# Patient Record
Sex: Male | Born: 1990 | Race: Black or African American | Hispanic: No | Marital: Single | Smoking: Current every day smoker
Health system: Southern US, Community
[De-identification: ages and names within clinical notes are randomized; demographics above are authoritative.]

## PROBLEM LIST (undated history)

## (undated) DIAGNOSIS — J302 Other seasonal allergic rhinitis: Secondary | ICD-10-CM

## (undated) DIAGNOSIS — K219 Gastro-esophageal reflux disease without esophagitis: Secondary | ICD-10-CM

## (undated) HISTORY — PX: NO PAST SURGERIES: SHX2092

---

## 2012-02-17 ENCOUNTER — Other Ambulatory Visit: Payer: Self-pay | Admitting: Gastroenterology

## 2012-02-17 DIAGNOSIS — R1013 Epigastric pain: Secondary | ICD-10-CM

## 2012-02-26 ENCOUNTER — Encounter (HOSPITAL_COMMUNITY)
Admission: RE | Admit: 2012-02-26 | Discharge: 2012-02-26 | Disposition: A | Discharge status: Home / Self Care | Payer: BC Managed Care – PPO | Source: Ambulatory Visit | Attending: Gastroenterology | Admitting: Gastroenterology

## 2012-02-26 DIAGNOSIS — R1013 Epigastric pain: Secondary | ICD-10-CM | POA: Insufficient documentation

## 2012-02-26 MED ORDER — TECHNETIUM TC 99M SULFUR COLLOID
2.0000 | Freq: Once | INTRAVENOUS | Status: AC | PRN
  Administered 2012-02-26: 2 via INTRAVENOUS

## 2013-05-20 ENCOUNTER — Emergency Department (HOSPITAL_BASED_OUTPATIENT_CLINIC_OR_DEPARTMENT_OTHER)
Admission: EM | Admit: 2013-05-20 | Discharge: 2013-05-20 | Disposition: A | Discharge status: Home / Self Care | Payer: BC Managed Care – PPO | Attending: Emergency Medicine | Admitting: Emergency Medicine

## 2013-05-20 ENCOUNTER — Encounter (HOSPITAL_BASED_OUTPATIENT_CLINIC_OR_DEPARTMENT_OTHER): Payer: Self-pay | Admitting: *Deleted

## 2013-05-20 DIAGNOSIS — F17200 Nicotine dependence, unspecified, uncomplicated: Secondary | ICD-10-CM | POA: Insufficient documentation

## 2013-05-20 DIAGNOSIS — S61209A Unspecified open wound of unspecified finger without damage to nail, initial encounter: Secondary | ICD-10-CM | POA: Insufficient documentation

## 2013-05-20 DIAGNOSIS — Y92009 Unspecified place in unspecified non-institutional (private) residence as the place of occurrence of the external cause: Secondary | ICD-10-CM | POA: Insufficient documentation

## 2013-05-20 DIAGNOSIS — Y939 Activity, unspecified: Secondary | ICD-10-CM | POA: Insufficient documentation

## 2013-05-20 DIAGNOSIS — S61219A Laceration without foreign body of unspecified finger without damage to nail, initial encounter: Secondary | ICD-10-CM

## 2013-05-20 DIAGNOSIS — W260XXA Contact with knife, initial encounter: Secondary | ICD-10-CM | POA: Insufficient documentation

## 2013-05-20 NOTE — ED Provider Notes (Signed)
CSN: [id number]     Arrival date & time 05/20/13  0009 History   None    Chief Complaint  Patient presents with  . Laceration    HPI  Accidentally cut his right index finger with a kitchen knife at home several hours ago. Has a loose skin flap. Minimal bleeding. Good feeling and use History reviewed. No pertinent past medical history. History reviewed. No pertinent past surgical history. History reviewed. No pertinent family history. History  Substance Use Topics  . Smoking status: Current Every Day Smoker -- 0.50 packs/day    Types: Cigarettes  . Smokeless tobacco: Not on file  . Alcohol Use: No    Review of Systems Number bleeding normal use no numbness single digit injury Allergies  Review of patient's allergies indicates no known allergies.  Home Medications  No current outpatient prescriptions on file. BP 154/59  Pulse 92  Resp 16  Ht 5\' 7"  (1.702 m)  Wt 155 lb (70.308 kg)  BMI 24.27 kg/m2  SpO2 100% Physical Exam  Musculoskeletal:  2 cm curvilinear laceration to the P3 radial lateral of the right index finger. Extends to but not into the nail bed. Normal sensation distally. Immediate Refill    ED Course  LACERATION REPAIR Date/Time: 05/20/2013 12:49 AM Performed by: Claudean Kinds Authorized by: Claudean Kinds Consent: Verbal consent obtained. Risks and benefits: risks, benefits and alternatives were discussed Consent given by: patient Laceration length: 2 cm Tendon involvement: none Nerve involvement: none Vascular damage: no Anesthesia: local infiltration Local anesthetic: lidocaine 2% without epinephrine Anesthetic total: 4 ml Irrigation solution: saline Irrigation method: syringe Amount of cleaning: standard Debridement: none Degree of undermining: none Skin closure: 4-0 nylon Number of sutures: 5 Technique: simple Approximation: close Approximation difficulty: simple Dressing: gauze roll Patient tolerance: Patient tolerated the  procedure well with no immediate complications.   (including critical care time) Labs Review Labs Reviewed - No data to display Imaging Review No results found.  MDM   1. Finger laceration, initial encounter    Wound distress patient is discharged good bleeding control after repair    Claudean Kinds, MD 05/20/13 [phone]

## 2013-05-20 NOTE — ED Notes (Signed)
MD at bedside. 
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
## 2013-05-24 ENCOUNTER — Emergency Department (HOSPITAL_BASED_OUTPATIENT_CLINIC_OR_DEPARTMENT_OTHER): Payer: BC Managed Care – PPO

## 2013-05-24 ENCOUNTER — Encounter (HOSPITAL_BASED_OUTPATIENT_CLINIC_OR_DEPARTMENT_OTHER): Payer: Self-pay

## 2013-05-24 ENCOUNTER — Emergency Department (HOSPITAL_BASED_OUTPATIENT_CLINIC_OR_DEPARTMENT_OTHER)
Admission: EM | Admit: 2013-05-24 | Discharge: 2013-05-24 | Disposition: A | Discharge status: Home / Self Care | Payer: BC Managed Care – PPO | Attending: Emergency Medicine | Admitting: Emergency Medicine

## 2013-05-24 DIAGNOSIS — F17200 Nicotine dependence, unspecified, uncomplicated: Secondary | ICD-10-CM | POA: Insufficient documentation

## 2013-05-24 DIAGNOSIS — K2970 Gastritis, unspecified, without bleeding: Secondary | ICD-10-CM | POA: Insufficient documentation

## 2013-05-24 LAB — COMPREHENSIVE METABOLIC PANEL
ALT: 17 U/L (ref 0–53)
Albumin: 4.4 g/dL (ref 3.5–5.2)
Calcium: 10.2 mg/dL (ref 8.4–10.5)
GFR calc Af Amer: 81 mL/min — ABNORMAL LOW (ref >90)
Glucose, Bld: 98 mg/dL (ref 70–99)
Sodium: 138 mEq/L (ref 135–145)
Total Protein: 7.5 g/dL (ref 6.0–8.3)

## 2013-05-24 LAB — CBC WITH DIFFERENTIAL/PLATELET
Basophils Relative: 0 % (ref 0–1)
Eosinophils Absolute: 0.1 10*3/uL (ref 0.0–0.7)
Eosinophils Relative: 1 % (ref 0–5)
Lymphs Abs: 1.7 10*3/uL (ref 0.7–4.0)
MCH: 29.4 pg (ref 26.0–34.0)
MCHC: 33.1 g/dL (ref 30.0–36.0)
MCV: 88.9 fL (ref 78.0–100.0)
Neutrophils Relative %: 58 % (ref 43–77)
Platelets: 249 10*3/uL (ref 150–400)
RBC: 4.76 MIL/uL (ref 4.22–5.81)
RDW: 12.3 % (ref 11.5–15.5)

## 2013-05-24 LAB — LIPASE, BLOOD: Lipase: 20 U/L (ref 11–59)

## 2013-05-24 LAB — URINALYSIS, ROUTINE W REFLEX MICROSCOPIC
Hgb urine dipstick: NEGATIVE
Nitrite: NEGATIVE
Specific Gravity, Urine: 1.014 (ref 1.005–1.030)
Urobilinogen, UA: 0.2 mg/dL (ref 0.0–1.0)
pH: 7 (ref 5.0–8.0)

## 2013-05-24 MED ORDER — LANSOPRAZOLE 30 MG PO CPDR: 30.0000 mg | DELAYED_RELEASE_CAPSULE | Freq: Every day | ORAL | Status: DC

## 2013-05-24 NOTE — ED Notes (Signed)
MD at bedside. 

## 2013-05-24 NOTE — ED Notes (Signed)
Epigastric pain x 1-2 weeks-pain is worse in the am and radiates to back with SOB

## 2013-05-24 NOTE — ED Provider Notes (Signed)
CSN: [id number]     Arrival date & time 05/24/13  1316 History   First MD Initiated Contact with Patient 05/24/13 1328     Chief Complaint  Patient presents with  . Abdominal Pain   (Consider location/radiation/quality/duration/timing/severity/associated sxs/prior Treatment) Patient is a 22 y.o. male presenting with abdominal pain.  Abdominal Pain  Pt reports about 2 weeks of moderate aching epigastric pain radiating into his mid back, worse first thing in the AM and occasionally exacerbated with eating. Denies any vomiting, has had some loose stools. No fever. No bloody or melanic stools. Does not use excessive NSAIDs or EtOH.   History reviewed. No pertinent past medical history. History reviewed. No pertinent past surgical history. No family history on file. History  Substance Use Topics  . Smoking status: Current Every Day Smoker -- 0.50 packs/day    Types: Cigarettes  . Smokeless tobacco: Not on file  . Alcohol Use: Yes    Review of Systems  Gastrointestinal: Positive for abdominal pain.   All other systems reviewed and are negative except as noted in HPI.   Allergies  Review of patient's allergies indicates no known allergies.  Home Medications  No current outpatient prescriptions on file. BP 126/77  Pulse 63  Temp(Src) 98.5 F (36.9 C) (Oral)  Resp 16  Ht 5\' 7"  (1.702 m)  Wt 155 lb (70.308 kg)  BMI 24.27 kg/m2  SpO2 100% Physical Exam  Nursing note and vitals reviewed. Constitutional: He is oriented to person, place, and time. He appears well-developed and well-nourished.  HENT:  Head: Normocephalic and atraumatic.  Eyes: EOM are normal. Pupils are equal, round, and reactive to light.  Neck: Normal range of motion. Neck supple.  Cardiovascular: Normal rate, normal heart sounds and intact distal pulses.   Pulmonary/Chest: Effort normal and breath sounds normal. He has no wheezes. He has no rales.  Abdominal: Bowel sounds are normal. He exhibits no distension.  There is tenderness (mild epigastric, no RUQ tenderness, neg Murphy's). There is no rebound and no guarding.  Musculoskeletal: Normal range of motion. He exhibits no edema and no tenderness.  Neurological: He is alert and oriented to person, place, and time. He has normal strength. No cranial nerve deficit or sensory deficit.  Skin: Skin is warm and dry. No rash noted.  Psychiatric: He has a normal mood and affect.    ED Course  Procedures (including critical care time) Labs Review Labs Reviewed  COMPREHENSIVE METABOLIC PANEL - Abnormal; Notable for the following:    Creatinine, Ser 1.40 (*)    GFR calc non Af Amer 70 (*)    GFR calc Af Amer 81 (*)    All other components within normal limits  CBC WITH DIFFERENTIAL  LIPASE, BLOOD  URINALYSIS, ROUTINE W REFLEX MICROSCOPIC   Imaging Review US Abdomen Complete  05/24/2013   *RADIOLOGY REPORT*  Clinical Data:  Epigastric pain.  Question gallstones.  COMPLETE ABDOMINAL ULTRASOUND  Comparison:  None.  Findings:  Gallbladder:  No gallstones, gallbladder wall thickening, or pericholecystic fluid.  Common bile duct:  Measures up to 2 mm, which is normal.  Liver:  No focal lesion identified.  Within normal limits in parenchymal echogenicity.  IVC:  Limited due to overlying bowel gas.  Pancreas:  Not well visualized due to overlying bowel gas.  Spleen:  Measures 7 cm.  Normal in appearance.  Right Kidney:  No renal calculi or hydronephrosis. Maximal dimension measures up to 10.1 cm.  Left Kidney:  No renal calculi or  hydronephrosis. Maximal dimension measures up to 10.2 cm.  Abdominal aorta:  Limited evaluation due to overlying bowel gas. The maximal diameter visualized is 1.9 cm, which is normal.  IMPRESSION: No cholelithiasis or evidence of acute cholecystitis.  Limited evaluation of the pancreas, IVC, and abdominal aorta.   Original Report Authenticated By: Jerene Dilling, M.D.    MDM   1. Gastritis     Labs and imaging reviewed and  unremarkable aside from mild renal insufficiency. Advised to drink plenty of fluids, PPI for possible gastritis and PCP/GI followup.     Malaak Stach B. Bernette Mayers, MD 05/24/13 1444

## 2013-05-31 ENCOUNTER — Emergency Department (HOSPITAL_BASED_OUTPATIENT_CLINIC_OR_DEPARTMENT_OTHER)
Admission: EM | Admit: 2013-05-31 | Discharge: 2013-05-31 | Disposition: A | Discharge status: Home / Self Care | Payer: PRIVATE HEALTH INSURANCE | Attending: Emergency Medicine | Admitting: Emergency Medicine

## 2013-05-31 ENCOUNTER — Encounter (HOSPITAL_BASED_OUTPATIENT_CLINIC_OR_DEPARTMENT_OTHER): Payer: Self-pay | Admitting: Emergency Medicine

## 2013-05-31 DIAGNOSIS — F17200 Nicotine dependence, unspecified, uncomplicated: Secondary | ICD-10-CM | POA: Insufficient documentation

## 2013-05-31 DIAGNOSIS — Z79899 Other long term (current) drug therapy: Secondary | ICD-10-CM | POA: Insufficient documentation

## 2013-05-31 DIAGNOSIS — Z4802 Encounter for removal of sutures: Secondary | ICD-10-CM

## 2013-05-31 NOTE — ED Notes (Signed)
Suture removal

## 2013-05-31 NOTE — ED Provider Notes (Signed)
Medical screening examination/treatment/procedure(s) were performed by non-physician practitioner and as supervising physician I was immediately available for consultation/collaboration.  Doug Sou, MD 05/31/13 1324

## 2013-05-31 NOTE — ED Provider Notes (Signed)
CSN: [id number]     Arrival date & time 05/31/13  1478 History   First MD Initiated Contact with Patient 05/31/13 [phone]     Chief Complaint  Patient presents with  . Suture / Staple Removal   (Consider location/radiation/quality/duration/timing/severity/associated sxs/prior Treatment) HPI Comments: Pt had suture placed in finger and pt is here for removal:denies any problems  Patient is a 22 y.o. male presenting with suture removal. The history is provided by the patient. No language interpreter was used.  Suture / Staple Removal This is a new problem. The current episode started 1 to 4 weeks ago. The problem occurs constantly. The problem has been unchanged.    History reviewed. No pertinent past medical history. History reviewed. No pertinent past surgical history. History reviewed. No pertinent family history. History  Substance Use Topics  . Smoking status: Current Every Day Smoker -- 0.50 packs/day    Types: Cigarettes  . Smokeless tobacco: Not on file  . Alcohol Use: Yes    Review of Systems  Constitutional: Negative.   Respiratory: Negative.   Cardiovascular: Negative.     Allergies  Review of patient's allergies indicates no known allergies.  Home Medications   Current Outpatient Rx  Name  Route  Sig  Dispense  Refill  . lansoprazole (PREVACID) 30 MG capsule   Oral   Take 1 capsule (30 mg total) by mouth daily.   30 capsule   0    BP 118/57  Pulse 69  Temp(Src) 98.4 F (36.9 C) (Oral)  SpO2 100% Physical Exam  Nursing note and vitals reviewed. Constitutional: He appears well-developed and well-nourished.  Cardiovascular: Normal rate and regular rhythm.   Pulmonary/Chest: Effort normal and breath sounds normal.  Skin:  Pt has well healed wound to the left index finger:pt has full rom:do redness or drainage noted to the area    ED Course  SUTURE REMOVAL Date/Time: 05/31/2013 10:02 AM Performed by: Teressa Lower Authorized by: Teressa Lower Consent: Verbal consent obtained. Risks and benefits: risks, benefits and alternatives were discussed Consent given by: patient Wound Appearance: clean Sutures Removed: 6 Facility: sutures placed in this facility Patient tolerance: Patient tolerated the procedure well with no immediate complications.   (including critical care time) Labs Review Labs Reviewed - No data to display Imaging Review No results found.  MDM   1. Visit for suture removal    Sutures removed without any problem:no sign of infection    Teressa Lower, NP 05/31/13 1003

## 2013-09-29 ENCOUNTER — Emergency Department (HOSPITAL_COMMUNITY)
Admission: EM | Admit: 2013-09-29 | Discharge: 2013-09-29 | Disposition: A | Discharge status: Home / Self Care | Payer: No Typology Code available for payment source | Attending: Emergency Medicine | Admitting: Emergency Medicine

## 2013-09-29 ENCOUNTER — Emergency Department (HOSPITAL_COMMUNITY): Payer: No Typology Code available for payment source

## 2013-09-29 ENCOUNTER — Encounter (HOSPITAL_COMMUNITY): Payer: Self-pay | Admitting: Emergency Medicine

## 2013-09-29 DIAGNOSIS — M542 Cervicalgia: Secondary | ICD-10-CM

## 2013-09-29 DIAGNOSIS — IMO0002 Reserved for concepts with insufficient information to code with codable children: Secondary | ICD-10-CM | POA: Insufficient documentation

## 2013-09-29 DIAGNOSIS — S199XXA Unspecified injury of neck, initial encounter: Principal | ICD-10-CM

## 2013-09-29 DIAGNOSIS — Y92410 Unspecified street and highway as the place of occurrence of the external cause: Secondary | ICD-10-CM | POA: Insufficient documentation

## 2013-09-29 DIAGNOSIS — F17200 Nicotine dependence, unspecified, uncomplicated: Secondary | ICD-10-CM | POA: Insufficient documentation

## 2013-09-29 DIAGNOSIS — S0993XA Unspecified injury of face, initial encounter: Secondary | ICD-10-CM | POA: Insufficient documentation

## 2013-09-29 DIAGNOSIS — Y9389 Activity, other specified: Secondary | ICD-10-CM | POA: Insufficient documentation

## 2013-09-29 HISTORY — DX: Other seasonal allergic rhinitis: J30.2

## 2013-09-29 HISTORY — DX: Gastro-esophageal reflux disease without esophagitis: K21.9

## 2013-09-29 MED ORDER — INFLUENZA VAC SPLIT QUAD 0.5 ML IM SUSP: 0.5000 mL | INTRAMUSCULAR | Status: DC

## 2013-09-29 MED ORDER — PNEUMOCOCCAL VAC POLYVALENT 25 MCG/0.5ML IJ INJ: 0.5000 mL | INJECTION | INTRAMUSCULAR | Status: DC

## 2013-09-29 MED ORDER — NAPROXEN 500 MG PO TABS: 500.0000 mg | ORAL_TABLET | Freq: Two times a day (BID) | ORAL | Status: DC
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
## 2013-09-29 NOTE — ED Notes (Signed)
MD at bedside. 

## 2013-10-02 NOTE — ED Provider Notes (Signed)
Medical screening examination/treatment/procedure(s) were performed by non-physician practitioner and as supervising physician I was immediately available for consultation/collaboration.  EKG Interpretation   None         Kameria Canizares, MD 10/02/13 0715 

## 2013-11-13 ENCOUNTER — Emergency Department (HOSPITAL_COMMUNITY): Payer: No Typology Code available for payment source

## 2013-11-13 ENCOUNTER — Encounter (HOSPITAL_COMMUNITY): Payer: Self-pay | Admitting: Emergency Medicine

## 2013-11-13 ENCOUNTER — Emergency Department (HOSPITAL_COMMUNITY)
Admission: EM | Admit: 2013-11-13 | Discharge: 2013-11-13 | Disposition: A | Discharge status: Home / Self Care | Payer: No Typology Code available for payment source | Attending: Emergency Medicine | Admitting: Emergency Medicine

## 2013-11-13 DIAGNOSIS — Z8719 Personal history of other diseases of the digestive system: Secondary | ICD-10-CM | POA: Insufficient documentation

## 2013-11-13 DIAGNOSIS — R079 Chest pain, unspecified: Secondary | ICD-10-CM | POA: Insufficient documentation

## 2013-11-13 DIAGNOSIS — F17200 Nicotine dependence, unspecified, uncomplicated: Secondary | ICD-10-CM | POA: Insufficient documentation

## 2013-11-13 LAB — BASIC METABOLIC PANEL
BUN: 15 mg/dL (ref 6–23)
CALCIUM: 9.5 mg/dL (ref 8.4–10.5)
CO2: 27 mEq/L (ref 19–32)
CREATININE: 1.02 mg/dL (ref 0.50–1.35)
Chloride: 99 mEq/L (ref 96–112)
GLUCOSE: 104 mg/dL — AB (ref 70–99)
POTASSIUM: 4.5 meq/L (ref 3.7–5.3)
Sodium: 137 mEq/L (ref 137–147)

## 2013-11-13 LAB — CBC
HEMATOCRIT: 41.7 % (ref 39.0–52.0)
HEMOGLOBIN: 13.7 g/dL (ref 13.0–17.0)
MCH: 29.3 pg (ref 26.0–34.0)
MCHC: 32.9 g/dL (ref 30.0–36.0)
MCV: 89.3 fL (ref 78.0–100.0)
Platelets: 238 10*3/uL (ref 150–400)
RBC: 4.67 MIL/uL (ref 4.22–5.81)
RDW: 12.7 % (ref 11.5–15.5)
WBC: 6.6 10*3/uL (ref 4.0–10.5)

## 2013-11-13 LAB — I-STAT TROPONIN, ED: Troponin i, poc: 0 ng/mL (ref 0.00–0.08)

## 2013-11-13 MED ORDER — HYDROCODONE-ACETAMINOPHEN 5-325 MG PO TABS
1.0000 | ORAL_TABLET | Freq: Once | ORAL | Status: AC
  Administered 2013-11-13: 1 via ORAL
  Filled 2013-11-13: qty 1

## 2013-11-13 MED ORDER — HYDROCODONE-ACETAMINOPHEN 5-325 MG PO TABS
1.0000 | ORAL_TABLET | ORAL | Status: AC | PRN
Start: 2013-11-13 — End: ?

## 2013-11-13 NOTE — Discharge Instructions (Signed)
Chest Pain (Nonspecific) °It is often hard to give a specific diagnosis for the cause of chest pain. There is always a chance that your pain could be related to something serious, such as a heart attack or a blood clot in the lungs. You need to follow up with your caregiver for further evaluation. °CAUSES  °· Heartburn. °· Pneumonia or bronchitis. °· Anxiety or stress. °· Inflammation around your heart (pericarditis) or lung (pleuritis or pleurisy). °· A blood clot in the lung. °· A collapsed lung (pneumothorax). It can develop suddenly on its own (spontaneous pneumothorax) or from injury (trauma) to the chest. °· Shingles infection (herpes zoster virus). °The chest wall is composed of bones, muscles, and cartilage. Any of these can be the source of the pain. °· The bones can be bruised by injury. °· The muscles or cartilage can be strained by coughing or overwork. °· The cartilage can be affected by inflammation and become sore (costochondritis). °DIAGNOSIS  °Lab tests or other studies, such as X-rays, electrocardiography, stress testing, or cardiac imaging, may be needed to find the cause of your pain.  °TREATMENT  °· Treatment depends on what may be causing your chest pain. Treatment may include: °· Acid blockers for heartburn. °· Anti-inflammatory medicine. °· Pain medicine for inflammatory conditions. °· Antibiotics if an infection is present. °· You may be advised to change lifestyle habits. This includes stopping smoking and avoiding alcohol, caffeine, and chocolate. °· You may be advised to keep your head raised (elevated) when sleeping. This reduces the chance of acid going backward from your stomach into your esophagus. °· Most of the time, nonspecific chest pain will improve within 2 to 3 days with rest and mild pain medicine. °HOME CARE INSTRUCTIONS  °· If antibiotics were prescribed, take your antibiotics as directed. Finish them even if you start to feel better. °· For the next few days, avoid physical  activities that bring on chest pain. Continue physical activities as directed. °· Do not smoke. °· Avoid drinking alcohol. °· Only take over-the-counter or prescription medicine for pain, discomfort, or fever as directed by your caregiver. °· Follow your caregiver's suggestions for further testing if your chest pain does not go away. °· Keep any follow-up appointments you made. If you do not go to an appointment, you could develop lasting (chronic) problems with pain. If there is any problem keeping an appointment, you must call to reschedule. °SEEK MEDICAL CARE IF:  °· You think you are having problems from the medicine you are taking. Read your medicine instructions carefully. °· Your chest pain does not go away, even after treatment. °· You develop a rash with blisters on your chest. °SEEK IMMEDIATE MEDICAL CARE IF:  °· You have increased chest pain or pain that spreads to your arm, neck, jaw, back, or abdomen. °· You develop shortness of breath, an increasing cough, or you are coughing up blood. °· You have severe back or abdominal pain, feel nauseous, or vomit. °· You develop severe weakness, fainting, or chills. °· You have a fever. °THIS IS AN EMERGENCY. Do not wait to see if the pain will go away. Get medical help at once. Call your local emergency services (911 in [place] Do not drive yourself to the hospital. °MAKE SURE YOU:  °· Understand these instructions. °· Will watch your condition. °· Will get help right away if you are not doing well or get worse. °Document Released: 06/12/2005 Document Revised: 11/25/2011 Document Reviewed: 04/07/2008 °[employer]® Patient Information ©2014 [employer]   [employer]. ° °

## 2013-11-13 NOTE — ED Notes (Signed)
Pt from home c/o chest pain x1 week. Pt states that the pain started in his back and now in chest. Pt denies cough, N/V, SOB or diaphoresis. Pt is A&O and in NAD

## 2013-11-13 NOTE — ED Provider Notes (Addendum)
CSN: [id number]     Arrival date & time 11/13/13  1148 History   First MD Initiated Contact with Patient 11/13/13 1159     Chief Complaint  Patient presents with  . Chest Pain     (Consider location/radiation/quality/duration/timing/severity/associated sxs/prior Treatment) HPI Comments: Feels like he has palpitations. Pt state that it often happens at night. Denies drug use, cough, fever, abdominal pain  Patient is a 23 y.o. male presenting with chest pain. The history is provided by the patient. No language interpreter was used.  Chest Pain Pain location:  L chest Pain quality: aching   Pain radiates to:  Does not radiate Pain radiates to the back: no   Pain severity:  Mild Onset quality:  Sudden Timing:  Intermittent Progression:  Unchanged Context: breathing   Context: no drug use, no movement and no stress   Relieved by:  Nothing Ineffective treatments: nsaids. Associated symptoms: no fever, no nausea, no shortness of breath and not vomiting     Past Medical History  Diagnosis Date  . Seasonal allergies   . GERD (gastroesophageal reflux disease)    Past Surgical History  Procedure Laterality Date  . No past surgeries     No family history on file. History  Substance Use Topics  . Smoking status: Current Every Day Smoker -- 0.50 packs/day for 6 years    Types: Cigarettes  . Smokeless tobacco: Never Used  . Alcohol Use: Yes     Comment: "time to time"    Review of Systems  Constitutional: Negative for fever.  Respiratory: Negative for shortness of breath.   Cardiovascular: Positive for chest pain.  Gastrointestinal: Negative for nausea and vomiting.      Allergies  Review of patient's allergies indicates no known allergies.  Home Medications   Current Outpatient Rx  Name  Route  Sig  Dispense  Refill  . ibuprofen (ADVIL,MOTRIN) 200 MG tablet   Oral   Take 400 mg by mouth every 6 (six) hours as needed for headache or moderate pain.           BP  140/59  Pulse 83  Temp(Src) 97.7 F (36.5 C)  Resp 20  SpO2 100% Physical Exam  Nursing note and vitals reviewed. Constitutional: He is oriented to person, place, and time. He appears well-developed and well-nourished.  HENT:  Head: Normocephalic and atraumatic.  Cardiovascular: Normal rate and regular rhythm.   Pulmonary/Chest: Effort normal and breath sounds normal.  Musculoskeletal: Normal range of motion.  Neurological: He is alert and oriented to person, place, and time.  Skin: Skin is warm and dry.    ED Course  Procedures (including critical care time) Labs Review Labs Reviewed  BASIC METABOLIC PANEL - Abnormal; Notable for the following:    Glucose, Bld 104 (*)    All other components within normal limits  CBC  I-STAT TROPOININ, ED   Imaging Review Dg Chest 2 View  11/13/2013   CLINICAL DATA:  Left side chest pain  EXAM: CHEST  2 VIEW  COMPARISON:  None.  FINDINGS: Cardiomediastinal silhouette is stable. No acute infiltrate or pleural effusion. No pulmonary edema. Bony thorax is unremarkable.  IMPRESSION: No active cardiopulmonary disease.   Electronically Signed   By: Natasha MeadLiviu  Pop M.D.   On: 11/13/2013 13:10     EKG Interpretation   Date/Time:  Saturday November 13 2013 12:01:24 EST Ventricular Rate:  56 PR Interval:  173 QRS Duration: 86 QT Interval:  385 QTC Calculation: 371 R Axis:  51 Text Interpretation:  Sinus rhythm Ventricular premature complex  Borderline T wave abnormalities No old tracing to compare Confirmed by  [facility]  MD, Leonette Most [id number]) on 11/13/2013 12:29:18 PM      MDM   Final diagnoses:  Chest pain    Pt is pain free at this time:pt is okay to follow up as needed:will discharge with something for pain. Doubt acs or pe.     Teressa Lower, NP 11/13/13 1439  Teressa Lower, NP 11/24/13 1215

## 2013-11-13 NOTE — ED Provider Notes (Signed)
Medical screening examination/treatment/procedure(s) were performed by non-physician practitioner and as supervising physician I was immediately available for consultation/collaboration.   EKG Interpretation   Date/Time:  Saturday November 13 2013 12:01:24 EST Ventricular Rate:  56 PR Interval:  173 QRS Duration: 86 QT Interval:  385 QTC Calculation: 371 R Axis:   51 Text Interpretation:  Sinus rhythm Ventricular premature complex  Borderline T wave abnormalities No old tracing to compare Confirmed by  [facility]  [facility], Cullan Launer [phone] on 11/13/2013 12:29:18 PM        Bonnita Levanharles B. Bernette MayersSheldon, MD 11/13/13 [phone]

## 2013-11-26 NOTE — ED Provider Notes (Signed)
Medical screening examination/treatment/procedure(s) were performed by non-physician practitioner and as supervising physician I was immediately available for consultation/collaboration.   EKG Interpretation   Date/Time:  Saturday November 13 2013 12:01:24 EST Ventricular Rate:  56 PR Interval:  173 QRS Duration: 86 QT Interval:  385 QTC Calculation: 371 R Axis:   51 Text Interpretation:  Sinus rhythm Ventricular premature complex  Borderline T wave abnormalities No old tracing to compare [facility]  [facility], CHARLES [phone] on 11/13/2013 12:29:18 PM        Bonnita Levanharles B. Bernette MayersSheldon, MD 11/26/13 1410

## 2014-07-27 ENCOUNTER — Encounter (HOSPITAL_COMMUNITY): Payer: Self-pay | Admitting: Emergency Medicine

## 2014-07-27 ENCOUNTER — Emergency Department (HOSPITAL_COMMUNITY)
Admission: EM | Admit: 2014-07-27 | Discharge: 2014-07-27 | Disposition: A | Discharge status: Home / Self Care | Payer: 59 | Attending: Emergency Medicine | Admitting: Emergency Medicine

## 2014-07-27 DIAGNOSIS — Z79899 Other long term (current) drug therapy: Secondary | ICD-10-CM | POA: Diagnosis not present

## 2014-07-27 DIAGNOSIS — Z720 Tobacco use: Secondary | ICD-10-CM | POA: Diagnosis not present

## 2014-07-27 DIAGNOSIS — M545 Low back pain: Secondary | ICD-10-CM | POA: Diagnosis present

## 2014-07-27 DIAGNOSIS — Z8719 Personal history of other diseases of the digestive system: Secondary | ICD-10-CM | POA: Diagnosis not present

## 2014-07-27 MED ORDER — METHOCARBAMOL 500 MG PO TABS: 500.0000 mg | ORAL_TABLET | Freq: Two times a day (BID) | ORAL | Status: AC

## 2014-07-27 MED ORDER — NAPROXEN 500 MG PO TABS
500.0000 mg | ORAL_TABLET | Freq: Two times a day (BID) | ORAL | Status: AC
Start: 2014-07-27 — End: ?

## 2014-07-27 MED ORDER — CYCLOBENZAPRINE HCL 10 MG PO TABS
5.0000 mg | ORAL_TABLET | Freq: Once | ORAL | Status: AC
  Administered 2014-07-27: 5 mg via ORAL
  Filled 2014-07-27: qty 1

## 2014-07-27 MED ORDER — IBUPROFEN 800 MG PO TABS
800.0000 mg | ORAL_TABLET | Freq: Once | ORAL | Status: AC
  Administered 2014-07-27: 800 mg via ORAL
  Filled 2014-07-27: qty 1

## 2014-07-27 NOTE — ED Notes (Signed)
Pt reports on Monday starting feeling generalized body aches and intermittent sharp back pain that radiates into ribcage. Pt denies recent fever or cough, denies any known injury. Pt ambulatory, NAD noted. Pt reports SOB yesterday but none today.

## 2014-07-27 NOTE — ED Provider Notes (Signed)
CSN: [id number]     Arrival date & time 07/27/14  1508 History   First MD Initiated Contact with Patient 07/27/14 1635     Chief Complaint  Patient presents with  . Generalized Body Aches  . Back Pain  . Weakness      HPI  Patient presents for evaluation of back pain.  States over the weekend he felt muscle aches "all over". He went to work Monday. He felt achy and some pain in his back. 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 No numbness weakness or tingling to his legs. No extremity symptoms. No fever chills cough or dysuria.  Past Medical History  Diagnosis Date  . Seasonal allergies   . GERD (gastroesophageal reflux disease)    Past Surgical History  Procedure Laterality Date  . No past surgeries     No family history on file. History  Substance Use Topics  . Smoking status: Current Every Day Smoker -- 0.50 packs/day for 6 years    Types: Cigarettes  . Smokeless tobacco: Never Used  . Alcohol Use: Yes     Comment: "time to time"    Review of Systems  Constitutional: Negative for fever, chills, diaphoresis, appetite change and fatigue.  HENT: Negative for mouth sores, sore throat and trouble swallowing.   Eyes: Negative for visual disturbance.  Respiratory: Negative for cough, chest tightness, shortness of breath and wheezing.   Cardiovascular: Negative for chest pain.  Gastrointestinal: Negative for nausea, vomiting, abdominal pain, diarrhea and abdominal distention.  Endocrine: Negative for polydipsia, polyphagia and polyuria.  Genitourinary: Negative for dysuria, frequency and hematuria.  Musculoskeletal: Positive for myalgias and back pain. Negative for gait problem.  Skin: Negative for color change, pallor and rash.  Neurological: Negative for dizziness, syncope, light-headedness and headaches.  Hematological: Does not bruise/bleed easily.  Psychiatric/Behavioral: Negative for behavioral problems and confusion.      Allergies  Review of  patient's allergies indicates no known allergies.  Home Medications   Prior to Admission medications   Medication Sig Start Date End Date Taking? Authorizing Provider  ibuprofen (ADVIL,MOTRIN) 600 MG tablet Take 600 mg by mouth every 6 (six) hours as needed for moderate pain (shoulder pain).   Yes Historical Provider, MD  sulindac (CLINORIL) 200 MG tablet Take 200 mg by mouth 2 (two) times daily as needed (muscle pain).   Yes Historical Provider, MD  HYDROcodone-acetaminophen (NORCO/VICODIN) 5-325 MG per tablet Take 1-2 tablets by mouth every 4 (four) hours as needed. 11/13/13   Teressa LowerVrinda Pickering, NP  ibuprofen (ADVIL,MOTRIN) 200 MG tablet Take 400 mg by mouth every 6 (six) hours as needed for headache or moderate pain.     Historical Provider, MD  methocarbamol (ROBAXIN) 500 MG tablet Take 1 tablet (500 mg total) by mouth 2 (two) times daily. 07/27/14   Rolland PorterMark Kaisa Wofford, MD  naproxen (NAPROSYN) 500 MG tablet Take 1 tablet (500 mg total) by mouth 2 (two) times daily. 07/27/14   Rolland PorterMark Dakiyah Heinke, MD   BP 134/69 mmHg  Pulse 89  Temp(Src) 98.6 F (37 C) (Oral)  Resp 16  Ht 5\' 8"  (1.727 m)  Wt 160 lb (72.576 kg)  BMI 24.33 kg/m2  SpO2 100% Physical Exam  Constitutional: He is oriented to person, place, and time. He appears well-developed and well-nourished. No distress.  HENT:  Head: Normocephalic.  Eyes: Conjunctivae are normal. Pupils are equal, round, and reactive to light. No scleral icterus.  Neck: Normal range of motion. Neck supple. No thyromegaly present.  Cardiovascular: Normal rate and regular rhythm.  Exam reveals no gallop and no friction rub.   No murmur heard. Pulmonary/Chest: Effort normal and breath sounds normal. No respiratory distress. He has no wheezes. He has no rales.  Clear basilar breath sounds.  Abdominal: Soft. Bowel sounds are normal. He exhibits no distension. There is no tenderness. There is no rebound.  Musculoskeletal: Normal range of motion.        Back:  Neurological: He is alert and oriented to person, place, and time.  Normal symmetric Strength to shoulder shrug, triceps, biceps, grip,wrist flex/extend,and intrinsics  Norma lsymmetric sensation above and below clavicles, and to all distributions to UEs. Norma symmetric strength to flex/.extend hip and knees, dorsi/plantar flex ankles. Normal symmetric sensation to all distributions to LEs Patellar and achilles reflexes 1-2+. Downgoing Babinski   Skin: Skin is warm and dry. No rash noted.  Psychiatric: He has a normal mood and affect. His behavior is normal.    ED Course  Procedures (including critical care time) Labs Review Labs Reviewed - No data to display  Imaging Review No results found.   EKG Interpretation None      MDM   Final diagnoses:  Low back pain without sciatica, unspecified back pain laterality     No neurological symptoms. Tenderness to palpate the musculature. No infectious symptoms. Afebrile here. No pleurisy. No urinary symptoms. I think this is simple musculoskeletal back pain. He may have had viral syndrome with myalgias over the weekend. However benign exam here. Plan is anti-inflammatories, muscle relaxants.   Rolland PorterMark Meyer Arora, MD 07/27/14 [phone]

## 2014-07-27 NOTE — Discharge Instructions (Signed)
Back Exercises Back exercises help treat and prevent back injuries. The goal of back exercises is to increase the strength of your abdominal and back muscles and the flexibility of your back. These exercises should be started when you no longer have back pain. Back exercises include:  Pelvic Tilt. Lie on your back with your knees bent. Tilt your pelvis until the lower part of your back is against the floor. Hold this position 5 to 10 sec and repeat 5 to 10 times.  Knee to Chest. Pull first 1 knee up against your chest and hold for 20 to 30 seconds, repeat this with the other knee, and then both knees. This may be done with the other leg straight or bent, whichever feels better.  Sit-Ups or Curl-Ups. Bend your knees 90 degrees. Start with tilting your pelvis, and do a partial, slow sit-up, lifting your trunk only 30 to 45 degrees off the floor. Take at least 2 to 3 seconds for each sit-up. Do not do sit-ups with your knees out straight. If partial sit-ups are difficult, simply do the above but with only tightening your abdominal muscles and holding it as directed.  Hip-Lift. Lie on your back with your knees flexed 90 degrees. Push down with your feet and shoulders as you raise your hips a couple inches off the floor; hold for 10 seconds, repeat 5 to 10 times.  Back arches. Lie on your stomach, propping yourself up on bent elbows. Slowly press on your hands, causing an arch in your low back. Repeat 3 to 5 times. Any initial stiffness and discomfort should lessen with repetition over time.  Shoulder-Lifts. Lie face down with arms beside your body. Keep hips and torso pressed to floor as you slowly lift your head and shoulders off the floor. Do not overdo your exercises, especially in the beginning. Exercises may cause you some mild back discomfort which lasts for a few minutes; however, if the pain is more severe, or lasts for more than 15 minutes, do not continue exercises until you see your caregiver.  Improvement with exercise therapy for back problems is slow.  See your caregivers for assistance with developing a proper back exercise program. Document Released: 10/10/2004 Document Revised: 11/25/2011 Document Reviewed: 07/04/2011 [facility] Patient Information 2015 [place], [place]. This information is not intended to replace advice given to you by your health care provider. Make sure you discuss any questions you have with your health care provider.  Back Injury Prevention Back injuries can be extremely painful and difficult to heal. After having one back injury, you are much more likely to experience another later on. It is important to learn how to avoid injuring or re-injuring your back. The following tips can help you to prevent a back injury. PHYSICAL FITNESS  Exercise regularly and try to develop good tone in your abdominal muscles. Your abdominal muscles provide a lot of the support needed by your back.  Do aerobic exercises (walking, jogging, biking, swimming) regularly.  Do exercises that increase balance and strength (tai chi, yoga) regularly. This can decrease your risk of falling and injuring your back.  Stretch before and after exercising.  Maintain a healthy weight. The more you weigh, the more stress is placed on your back. For every pound of weight, 10 times that amount of pressure is placed on the back. DIET  Talk to your caregiver about how much calcium and vitamin D you need per day. These nutrients help to prevent weakening of the bones (osteoporosis). Osteoporosis  can cause broken (fractured) bones that lead to back pain.  Include good sources of calcium in your diet, such as dairy products, green, leafy vegetables, and products with calcium added (fortified).  Include good sources of vitamin D in your diet, such as milk and foods that are fortified with vitamin D.  Consider taking a nutritional supplement or a multivitamin if needed.  Stop smoking if you  smoke. POSTURE  Sit and stand up straight. Avoid leaning forward when you sit or hunching over when you stand.  Choose chairs with good low back (lumbar) support.  If you work at a desk, sit close to your work so you do not need to lean over. Keep your chin tucked in. Keep your neck drawn back and elbows bent at a right angle. Your arms should look like the letter "L."  Sit high and close to the steering wheel when you drive. Add a lumbar support to your car seat if needed.  Avoid sitting or standing in one position for too long. Take breaks to get up, stretch, and walk around at least once every hour. Take breaks if you are driving for long periods of time.  Sleep on your side with your knees slightly bent, or sleep on your back with a pillow under your knees. Do not sleep on your stomach. LIFTING, TWISTING, AND REACHING  Avoid heavy lifting, especially repetitive lifting. If you must do heavy lifting:  Stretch before lifting.  Work slowly.  Rest between lifts.  Use carts and dollies to move objects when possible.  Make several small trips instead of carrying 1 heavy load.  Ask for help when you need it.  Ask for help when moving big, awkward objects.  Follow these steps when lifting:  Stand with your feet shoulder-width apart.  Get as close to the object as you can. Do not try to pick up heavy objects that are far from your body.  Use handles or lifting straps if they are available.  Bend at your knees. Squat down, but keep your heels off the floor.  Keep your shoulders pulled back, your chin tucked in, and your back straight.  Lift the object slowly, tightening the muscles in your legs, abdomen, and buttocks. Keep the object as close to the center of your body as possible.  When you put a load down, use these same guidelines in reverse.  Do not:  Lift the object above your waist.  Twist at the waist while lifting or carrying a load. Move your feet if you need to  turn, not your waist.  Bend over without bending at your knees.  Avoid reaching over your head, across a table, or for an object on a high surface. OTHER TIPS  Avoid wet floors and keep sidewalks clear of ice to prevent falls.  Do not sleep on a mattress that is too soft or too hard.  Keep items that are used frequently within easy reach.  Put heavier objects on shelves at waist level and lighter objects on lower or higher shelves.  Find ways to decrease your stress, such as exercise, massage, or relaxation techniques. Stress can build up in your muscles. Tense muscles are more vulnerable to injury.  Seek treatment for depression or anxiety if needed. These conditions can increase your risk of developing back pain. SEEK MEDICAL CARE IF:  You injure your back.  You have questions about diet, exercise, or other ways to prevent back injuries. MAKE SURE YOU:  Understand these  instructions. °· Will watch your condition. °· Will get help right away if you are not doing well or get worse. °Document Released: 10/10/2004 Document Revised: 11/25/2011 Document Reviewed: 10/14/2011 °[employer]® Patient Information ©2015 [employer]. This information is not intended to replace advice given to you by your health care provider. Make sure you discuss any questions you have with your health care provider. ° °Back Pain, Adult °Low back pain is very common. About 1 in 5 people have back pain. The cause of low back pain is rarely dangerous. The pain often gets better over time. About half of people with a sudden onset of back pain feel better in just 2 weeks. About 8 in 10 people feel better by 6 weeks.  °CAUSES °Some common causes of back pain include: °· Strain of the muscles or ligaments supporting the spine. °· Wear and tear (degeneration) of the spinal discs. °· Arthritis. °· Direct injury to the back. °DIAGNOSIS °Most of the time, the direct cause of low back pain is not known. However, back pain can be  treated effectively even when the exact cause of the pain is unknown. Answering your caregiver's questions about your overall health and symptoms is one of the most accurate ways to make sure the cause of your pain is not dangerous. If your caregiver needs more information, he or she may order lab work or imaging tests (X-rays or MRIs). However, even if imaging tests show changes in your back, this usually does not require surgery. °HOME CARE INSTRUCTIONS °For many people, back pain returns. Since low back pain is rarely dangerous, it is often a condition that people can learn to manage on their own.  °· Remain active. It is stressful on the back to sit or stand in one place. Do not sit, drive, or stand in one place for more than 30 minutes at a time. Take short walks on level surfaces as soon as pain allows. Try to increase the length of time you walk each day. °· Do not stay in bed. Resting more than 1 or 2 days can delay your recovery. °· Do not avoid exercise or work. Your body is made to move. It is not dangerous to be active, even though your back may hurt. Your back will likely heal faster if you return to being active before your pain is gone. °· Pay attention to your body when you  bend and lift. Many people have less discomfort when lifting if they bend their knees, keep the load close to their bodies, and avoid twisting. Often, the most comfortable positions are those that put less stress on your recovering back. °· Find a comfortable position to sleep. Use a firm mattress and lie on your side with your knees slightly bent. If you lie on your back, put a pillow under your knees. °· Only take over-the-counter or prescription medicines as directed by your caregiver. Over-the-counter medicines to reduce pain and inflammation are often the most helpful. Your caregiver may prescribe muscle relaxant drugs. These medicines help dull your pain so you can more quickly return to your normal activities and healthy  exercise. °· Put ice on the injured area. °¨ Put ice in a plastic bag. °¨ Place a towel between your skin and the bag. °¨ Leave the ice on for 15-20 minutes, 03-04 times a day for the first 2 to 3 days. After that, ice and heat may be alternated to reduce pain and spasms. °· Ask your caregiver about trying back exercises and gentle massage. This may be of some benefit. °· Avoid feeling anxious or stressed. Stress increases   muscle tension and can worsen back pain.It is important to recognize when you are anxious or stressed and learn ways to manage it.Exercise is a great option. SEEK MEDICAL CARE IF:  You have pain that is not relieved with rest or medicine.  You have pain that does not improve in 1 week.  You have new symptoms.  You are generally not feeling well. SEEK IMMEDIATE MEDICAL CARE IF:   You have pain that radiates from your back into your legs.  You develop new bowel or bladder control problems.  You have unusual weakness or numbness in your arms or legs.  You develop nausea or vomiting.  You develop abdominal pain.  You feel faint. Document Released: 09/02/2005 Document Revised: 03/03/2012 Document Reviewed: 01/04/2014 [facility] Patient Information 2015 [place], [place]. This information is not intended to replace advice given to you by your health care provider. Make sure you discuss any questions you have with your health care provider.
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
